# Patient Record
Sex: Female | Born: 1965 | Race: White | Hispanic: No | Marital: Single
Health system: Southern US, Community
[De-identification: ages and names within clinical notes are randomized; demographics above are authoritative.]

## PROBLEM LIST (undated history)

## (undated) DIAGNOSIS — E7800 Pure hypercholesterolemia, unspecified: Secondary | ICD-10-CM

## (undated) DIAGNOSIS — R6250 Unspecified lack of expected normal physiological development in childhood: Secondary | ICD-10-CM

## (undated) DIAGNOSIS — I10 Essential (primary) hypertension: Secondary | ICD-10-CM

---

## 2017-08-24 ENCOUNTER — Emergency Department: Payer: Medicare Other

## 2017-08-24 ENCOUNTER — Encounter: Payer: Self-pay | Admitting: Emergency Medicine

## 2017-08-24 ENCOUNTER — Emergency Department
Admission: EM | Admit: 2017-08-24 | Discharge: 2017-08-24 | Disposition: A | Discharge status: Home / Self Care | Payer: Medicare Other | Attending: Emergency Medicine | Admitting: Emergency Medicine

## 2017-08-24 DIAGNOSIS — I10 Essential (primary) hypertension: Secondary | ICD-10-CM

## 2017-08-24 DIAGNOSIS — R0602 Shortness of breath: Secondary | ICD-10-CM | POA: Diagnosis present

## 2017-08-24 DIAGNOSIS — Z760 Encounter for issue of repeat prescription: Secondary | ICD-10-CM | POA: Insufficient documentation

## 2017-08-24 DIAGNOSIS — Z79899 Other long term (current) drug therapy: Secondary | ICD-10-CM | POA: Insufficient documentation

## 2017-08-24 HISTORY — DX: Unspecified lack of expected normal physiological development in childhood: R62.50

## 2017-08-24 HISTORY — DX: Essential (primary) hypertension: I10

## 2017-08-24 HISTORY — DX: Pure hypercholesterolemia, unspecified: E78.00

## 2017-08-24 LAB — BASIC METABOLIC PANEL
Anion gap: 8 (ref 5–15)
BUN: 10 mg/dL (ref 6–20)
CALCIUM: 8.5 mg/dL — AB (ref 8.9–10.3)
CO2: 21 mmol/L — ABNORMAL LOW (ref 22–32)
Chloride: 109 mmol/L (ref 101–111)
Creatinine, Ser: 0.57 mg/dL (ref 0.44–1.00)
GFR calc Af Amer: >60 mL/min (ref >60)
GLUCOSE: 104 mg/dL — AB (ref 65–99)
POTASSIUM: 3.6 mmol/L (ref 3.5–5.1)
Sodium: 138 mmol/L (ref 135–145)

## 2017-08-24 LAB — CBC
HEMATOCRIT: 36.8 % (ref 35.0–47.0)
Hemoglobin: 12.4 g/dL (ref 12.0–16.0)
MCH: 28.3 pg (ref 26.0–34.0)
MCHC: 33.6 g/dL (ref 32.0–36.0)
MCV: 84.2 fL (ref 80.0–100.0)
Platelets: 354 10*3/uL (ref 150–440)
RBC: 4.37 MIL/uL (ref 3.80–5.20)
RDW: 16.4 % — AB (ref 11.5–14.5)
WBC: 7.2 10*3/uL (ref 3.6–11.0)

## 2017-08-24 LAB — TROPONIN I: Troponin I: <0.03 ng/mL (ref <0.03)

## 2017-08-24 MED ORDER — ATORVASTATIN CALCIUM 20 MG PO TABS: 20.0000 mg | ORAL_TABLET | Freq: Every day | ORAL | 0 refills | Status: AC

## 2017-08-24 MED ORDER — OXYBUTYNIN CHLORIDE 5 MG PO TABS: 5.0000 mg | ORAL_TABLET | Freq: Three times a day (TID) | ORAL | 0 refills | Status: AC | PRN

## 2017-08-24 MED ORDER — HYDROCHLOROTHIAZIDE 12.5 MG PO CAPS: 12.5000 mg | ORAL_CAPSULE | Freq: Every day | ORAL | 0 refills | Status: AC

## 2017-08-24 NOTE — Discharge Instructions (Addendum)
You have been seen in the Emergency Department (ED) today for chest pain.  As we have discussed today?s test results are normal, but you may require further testing.  Please follow up with the recommended doctor as instructed above in these documents regarding today?s emergent visit.  Return to the Emergency Department (ED) if you experience any further chest pain/pressure/tightness, difficulty breathing, or sudden sweating, or other symptoms that concern you.

## 2017-08-24 NOTE — ED Notes (Signed)
[place] [employer] were able to get in contact with pt's mother. Mother would not give her phone number to officers but states that she is on her way to the hospital.

## 2017-08-24 NOTE — ED Notes (Addendum)
Pt has a legal guardian, mother Mamie Hannen. Pt and mother share a cell phone and pt does not remember the phone number or any other relative's numbers. Pt's mother's phone number is not listed. Charge nurse aware.

## 2017-08-24 NOTE — ED Provider Notes (Signed)
[facility] Emergency Department Provider Note   ____________________________________________   First MD Initiated Contact with Patient 08/24/17 1635     (approximate)  I have reviewed the triage vital signs and the nursing notes.   HISTORY  Chief Complaint Hypertension and Shortness of Breath    HPI Tracey Sullivan is a 52 y.o. female has a history of cognitive delay, hypertension and high cholesterol  Patient presents today reports that she is feeling intermittently short of breath over the last several days to weeks.  She has had a couple shortness of breath the episodes will last for a few minutes over the last 2 days.  She also ran out of her blood pressure medicine about a week ago, and reports that she is felt slightly short of breath since that time.  She denies pain in the chest.  She does report she had one episode of chest pain couple of days ago that was brief, hard to describe, but did not feel like a heaviness.  Denies pain with deep inspiration.  No sharp chest pain.  She reports she has had the symptoms before, and when she takes her blood pressure medicine they get better.  No personal history of heart disease.  No fevers or chills no recent illness.  Moved to [place] and has not had access to her regular doctor and could not get her medications refilled.  Patient reports she would like to have her medications refilled   Past Medical History:  Diagnosis Date  . Developmental delay   . High cholesterol   . Hypertension     There are no active problems to display for this patient.     Prior to Admission medications   Medication Sig Start Date End Date Taking? Authorizing Provider  atorvastatin (LIPITOR) 20 MG tablet Take 1 tablet (20 mg total) by mouth daily. 08/24/17   Sharyn Creamer, MD  hydrochlorothiazide (MICROZIDE) 12.5 MG capsule Take 1 capsule (12.5 mg total) by mouth daily. 08/24/17   Sharyn Creamer, MD  oxybutynin (DITROPAN) 5 MG  tablet Take 1 tablet (5 mg total) by mouth every 8 (eight) hours as needed for bladder spasms. 08/24/17   Sharyn Creamer, MD    Allergies Patient has no known allergies.  No family history on file.  Social History Does not smoke drink or use drugs  Review of Systems Constitutional: No fever/chills Eyes: No visual changes. ENT: No sore throat. Cardiovascular: See HPI Respiratory: See HPI.  Does not feel short of breath now Gastrointestinal: No abdominal pain.  No nausea, no vomiting.  No diarrhea.  No constipation. Genitourinary: Negative for dysuria. Musculoskeletal: Negative for back pain. Skin: Negative for rash. Neurological: Negative for headaches, focal weakness or numbness.    ____________________________________________   PHYSICAL EXAM:  VITAL SIGNS: ED Triage Vitals  Enc Vitals Group     BP 08/24/17 1350 (!) 159/96     Pulse Rate 08/24/17 1350 95     Resp 08/24/17 1350 18     Temp 08/24/17 1350 98.7 F (37.1 C)     Temp Source 08/24/17 1350 Oral     SpO2 08/24/17 1350 97 %     Weight 08/24/17 1351 250 lb (113.4 kg)     Height 08/24/17 1351  (1.676 m)     Head Circumference --      Peak Flow --      Pain Score 08/24/17 1351 3     Pain Loc --  Pain Edu? --      Excl. in GC? --     Constitutional: Alert and oriented. Well appearing and in no acute distress.  Very pleasant.  She is slightly simple and responses, but is well oriented  eyes: Conjunctivae are normal. Head: Atraumatic. Nose: No congestion/rhinnorhea. Mouth/Throat: Mucous membranes are moist. Neck: No stridor.   Cardiovascular: Normal rate, regular rhythm. Grossly normal heart sounds.  Good peripheral circulation. Respiratory: Normal respiratory effort.  No retractions. Lungs CTAB. Gastrointestinal: Soft and nontender. No distention.  Obese. Musculoskeletal: No lower extremity tenderness nor edema. Neurologic:  Normal speech and language. No gross focal neurologic deficits are  appreciated.  Skin:  Skin is warm, dry and intact. No rash noted. Psychiatric: Mood and affect are normal to slightly flat. Speech and behavior are normal.  Denies recent surgery.  No long trips or travel.  No leg swelling.  No sharp chest pain.  No history of blood clots.  Does not smoke. ____________________________________________   LABS (all labs ordered are listed, but only abnormal results are displayed)  Labs Reviewed  BASIC METABOLIC PANEL - Abnormal; Notable for the following components:      Result Value   CO2 21 (*)    Glucose, Bld 104 (*)    Calcium 8.5 (*)    All other components within normal limits  CBC - Abnormal; Notable for the following components:   RDW 16.4 (*)    All other components within normal limits  TROPONIN I  POC URINE PREG, ED   ____________________________________________  EKG  ED ECG REPORT I, Sharyn Creamer, the attending physician, personally viewed and interpreted this ECG.  Date: 08/24/2017 EKG Time: 1305 Rate: 100 Rhythm: normal sinus rhythm QRS Axis: normal Intervals: normal ST/T Wave abnormalities: normal Narrative Interpretation: no evidence of acute ischemia  ____________________________________________  RADIOLOGY  Chest x-ray reviewed, negative for acute ____________________________________________   PROCEDURES  Procedure(s) performed: None  Procedures  Critical Care performed: No  ____________________________________________   INITIAL IMPRESSION / ASSESSMENT AND PLAN / ED COURSE  Pertinent labs & imaging results that were available during my care of the patient were reviewed by me and considered in my medical decision making (see chart for details).  Patient presents for evaluation, primarily requesting a refill of her blood pressure medication.  She reports she is had a couple episodes where she felt just slightly short of breath briefly over the last couple of days.  She is been out of her blood pressure medicine  for about a week.  She had a very hard to describe symptoms of chest discomfort about 2 days ago.    Her EKG and troponin are very reassuring.  Her blood work is also very reassuring.  She reports she feels well, asymptomatic.  Resting comfortably.  No evidence support acute coronary syndrome, no signs or symptoms of dissection, PE, or other acute life threat denoted.  No signs or symptoms of edema.  Very reassuring clinical examination.  We will refill the patient's prescriptions as obtained from her previous pharmacy.  Ports she is out of all of them.  ----------------------------------------- 8:34 PM on 08/24/2017 -----------------------------------------  Discussed case with the patient's mother as well as her brother now at the bedside.  They are comfortable plan for discharge.  Provide prescription refills, encourage primary care follow-up, she has a clinic next to her home that her sister goes to that she would like to be able to follow-up at her family medicine.  Careful return precautions advised the  patient and her mother.  Return precautions and treatment recommendations and follow-up discussed with the patient who is agreeable with the plan.   ____________________________________________   FINAL CLINICAL IMPRESSION(S) / ED DIAGNOSES  Final diagnoses:  Hypertension, unspecified type  Medication refill      NEW MEDICATIONS STARTED DURING THIS VISIT:  Current Discharge Medication List       Note:  This document was prepared using Dragon voice recognition software and may include unintentional dictation errors.     Sharyn Creamer, MD 08/24/17 2035

## 2017-08-24 NOTE — ED Triage Notes (Signed)
Patient presents to the ED via EMS from home.  Patient states she has had 2 episodes of shortness of breath in the past 24 hours and has been out of her hypertension medication since last week.  Patient reports an episode of chest pain as well.  Patient denies shortness of breath and chest pain at this time.  Patient states her SOB/CP has been worse when she has been out in the heat.

## 2017-11-17 ENCOUNTER — Other Ambulatory Visit: Payer: Self-pay | Admitting: Family Medicine

## 2017-11-17 DIAGNOSIS — Z1231 Encounter for screening mammogram for malignant neoplasm of breast: Secondary | ICD-10-CM

## 2018-05-27 ENCOUNTER — Other Ambulatory Visit: Payer: Self-pay | Admitting: Family Medicine

## 2018-05-27 DIAGNOSIS — Z1231 Encounter for screening mammogram for malignant neoplasm of breast: Secondary | ICD-10-CM

## 2018-10-10 IMAGING — CR DG CHEST 2V
1 series · 2 of 2 positions shown · non-contrast
Comparison: None.

CLINICAL DATA: Shortness of breath.  Chest pain.

EXAM:
CHEST - 2 VIEW

[Series 1: w chest pa · 0.14mm/px · 2 of 2 slices shown]
[im 1/2]
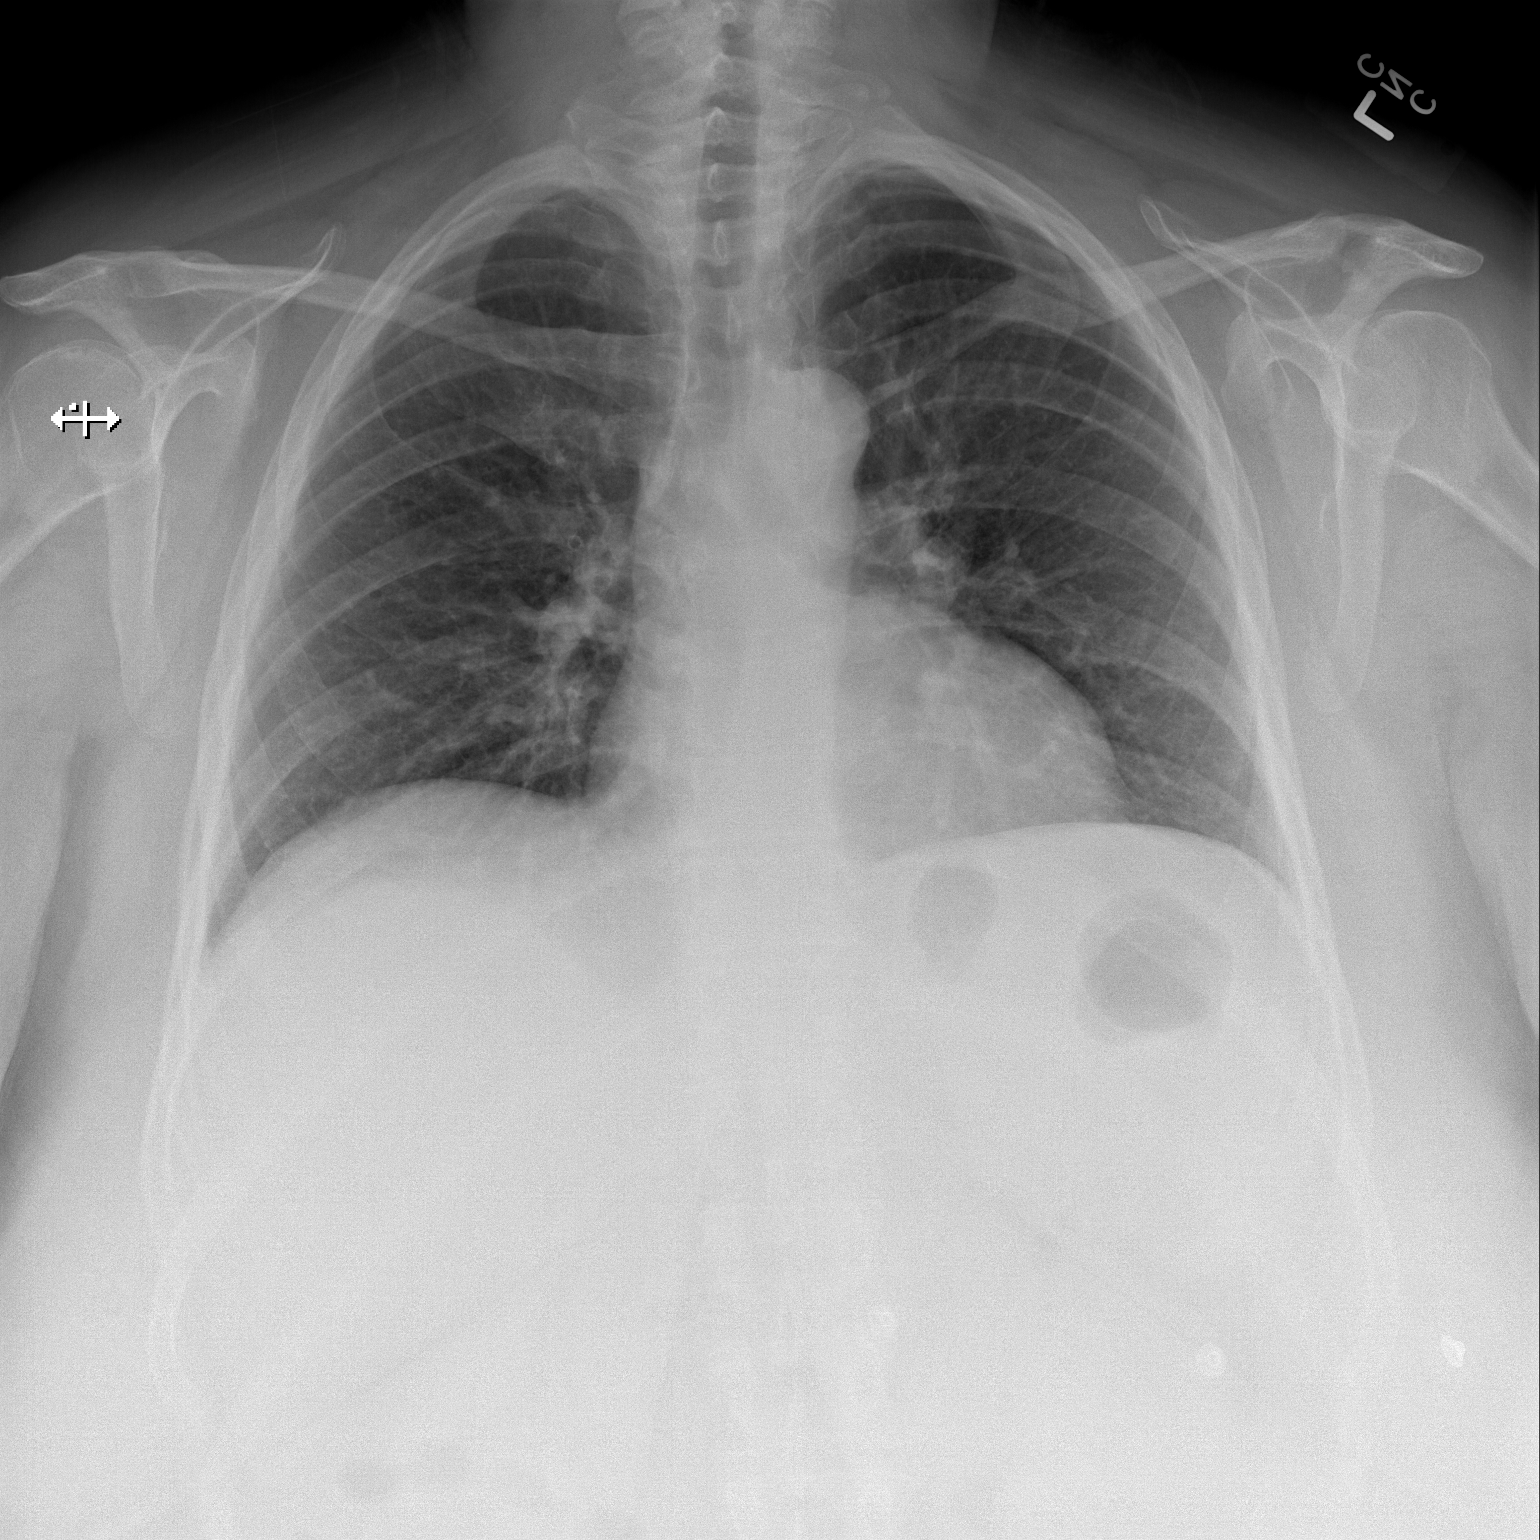
[im 2/2]
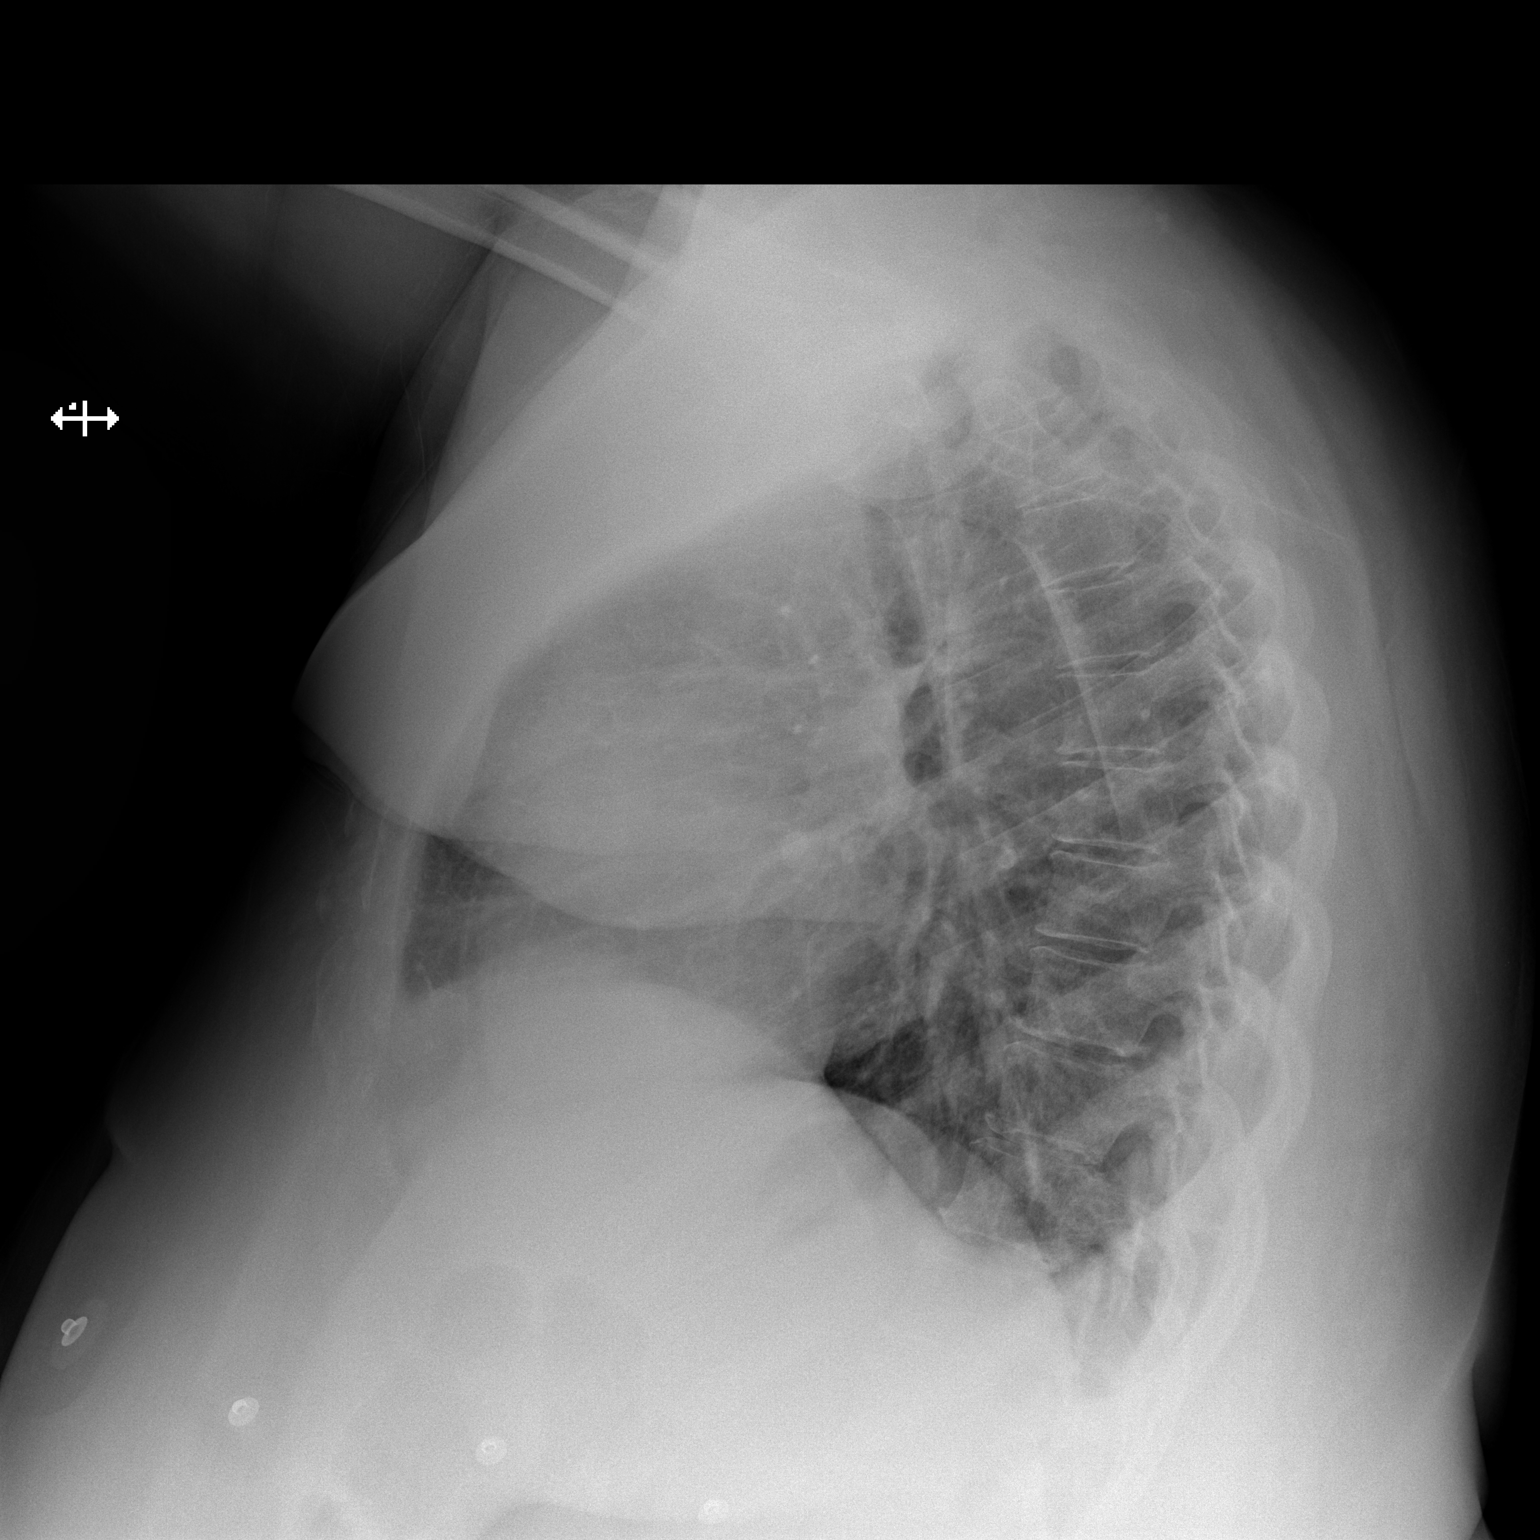

[2 of 2 positions shown; findings below may reference images not displayed]

FINDINGS: The heart size is normal. Lung volumes are low. The visualized soft
tissues and bony thorax are unremarkable.
IMPRESSION: Negative two view chest x-ray

## 2019-10-03 ENCOUNTER — Other Ambulatory Visit: Payer: Self-pay | Admitting: Family Medicine

## 2019-10-03 DIAGNOSIS — Z1231 Encounter for screening mammogram for malignant neoplasm of breast: Secondary | ICD-10-CM

## 2022-05-28 ENCOUNTER — Other Ambulatory Visit: Payer: Self-pay | Admitting: Family

## 2022-05-28 DIAGNOSIS — Z1231 Encounter for screening mammogram for malignant neoplasm of breast: Secondary | ICD-10-CM
